# Patient Record
Sex: Male | Born: 1995 | Race: White | Hispanic: No | Marital: Single | Smoking: Never smoker
Health system: Southern US, Community
[De-identification: ages and names within clinical notes are randomized; demographics above are authoritative.]

## PROBLEM LIST (undated history)

## (undated) DIAGNOSIS — M659 Synovitis and tenosynovitis, unspecified: Secondary | ICD-10-CM

## (undated) DIAGNOSIS — M25372 Other instability, left ankle: Secondary | ICD-10-CM

## (undated) HISTORY — PX: WISDOM TOOTH EXTRACTION: SHX21

## (undated) HISTORY — PX: COLONOSCOPY: SHX174

---
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
## 2013-03-16 IMAGING — CR DG FOOT COMPLETE 3+V*R*
3 series · 3 of 3 positions shown · non-contrast
Comparison: None.

CLINICAL DATA: Right foot injury with swelling.

RIGHT FOOT COMPLETE - 3+ VIEW

[x foot ap right]
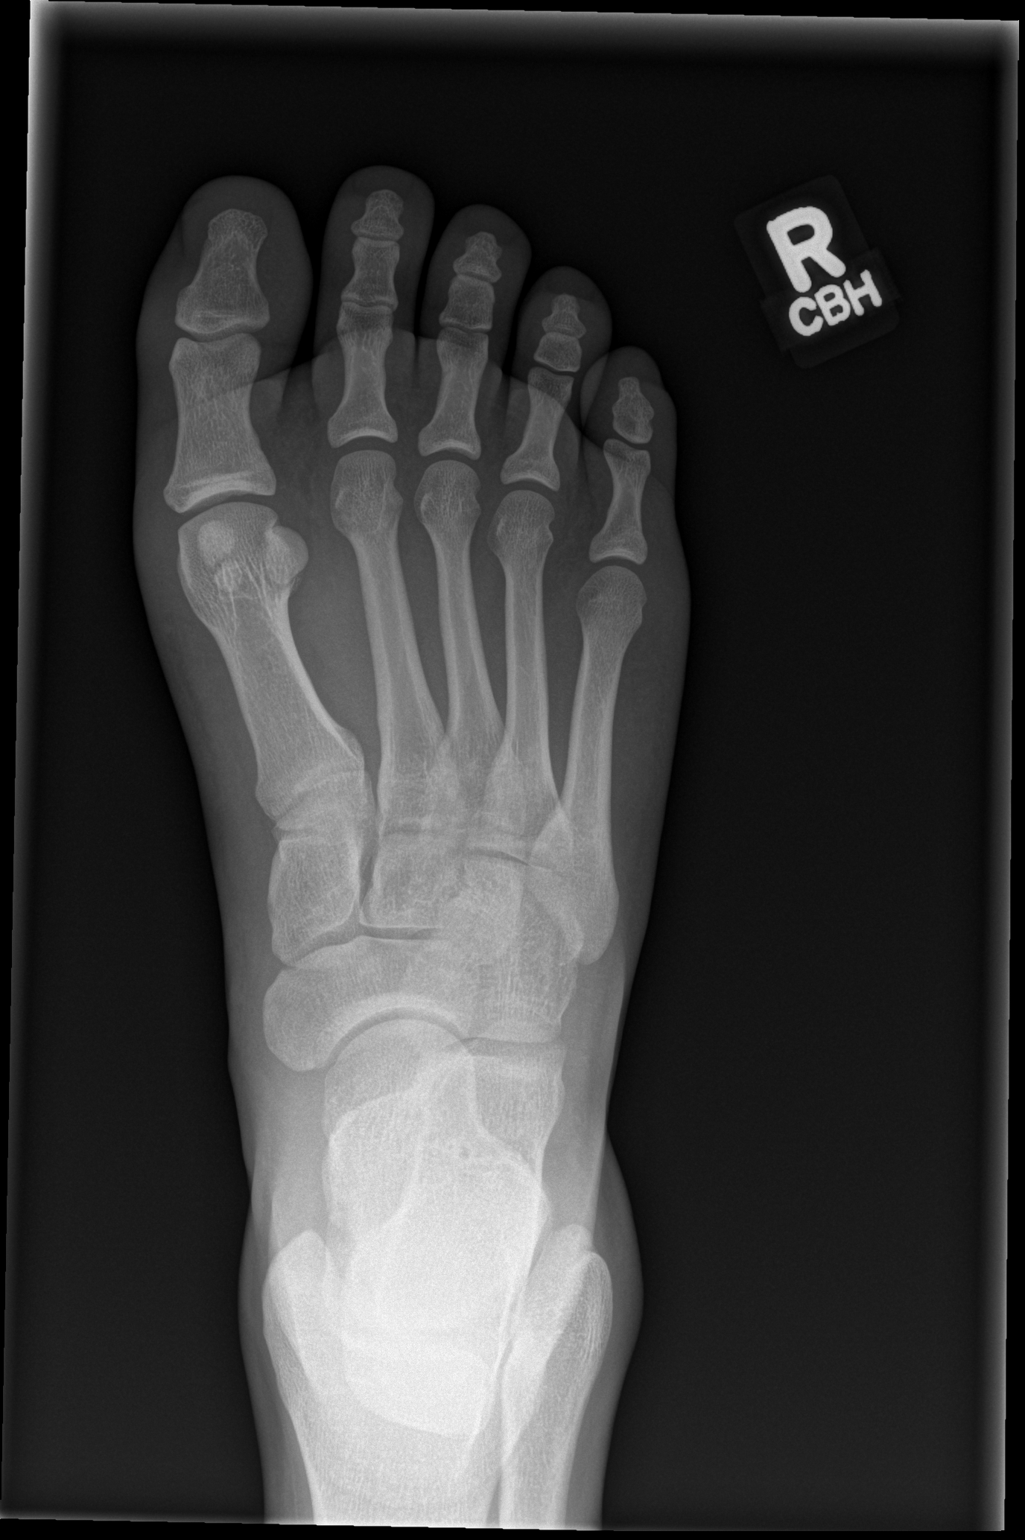

[x foot obl right]
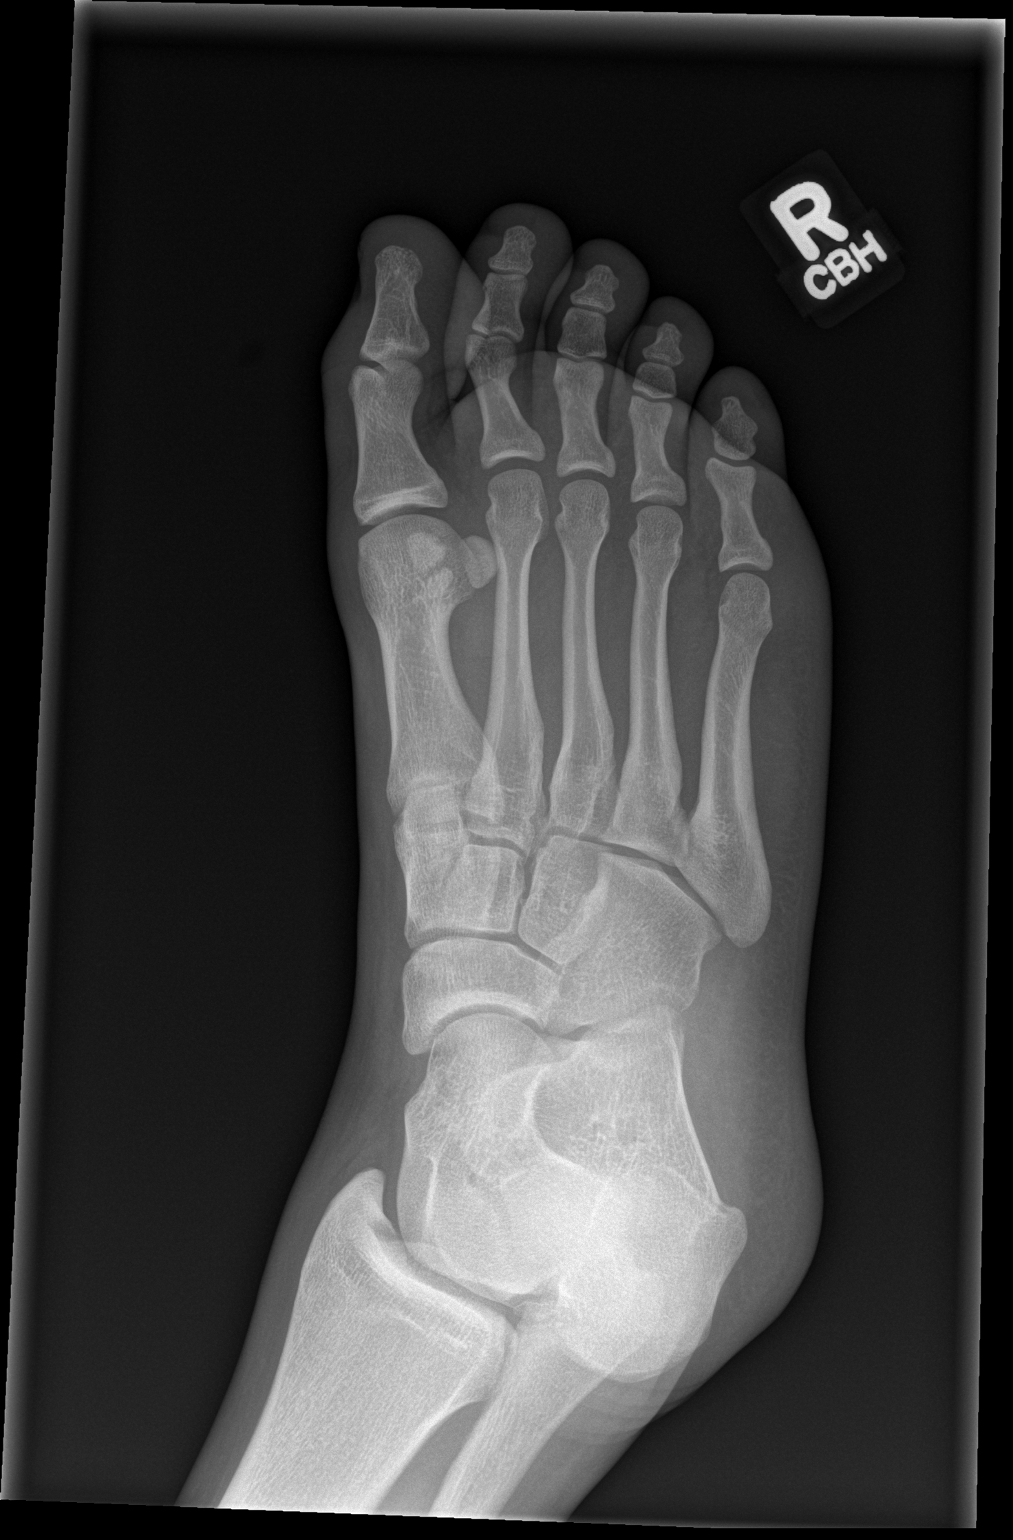

[x foot lat right]
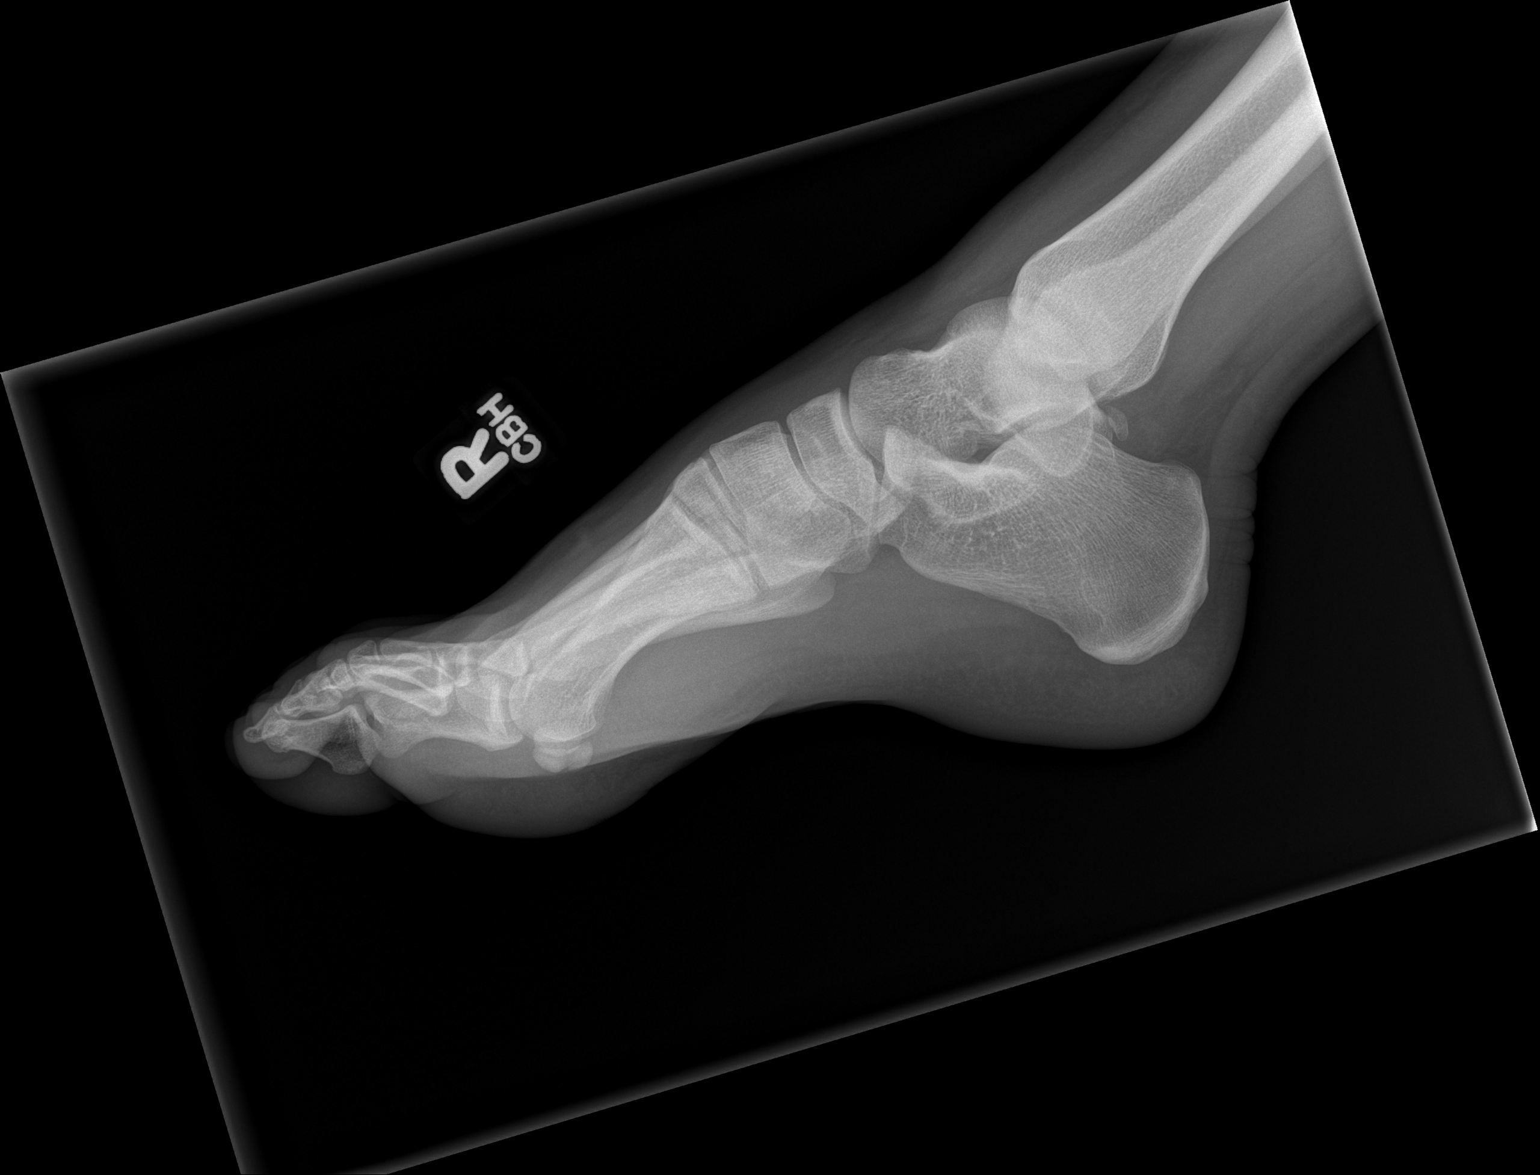

[3 of 3 positions shown; findings below may reference images not displayed]

FINDINGS: The mineralization and alignment are normal.  There is no
evidence of acute fracture or dislocation.  The tibial sesamoid of
the first metatarsal is bipartite.  No focal soft tissue swelling
is evident.
IMPRESSION: No acute osseous findings.

## 2014-11-27 DIAGNOSIS — M25372 Other instability, left ankle: Secondary | ICD-10-CM

## 2014-11-27 DIAGNOSIS — M65972 Unspecified synovitis and tenosynovitis, left ankle and foot: Secondary | ICD-10-CM

## 2014-11-27 DIAGNOSIS — M659 Synovitis and tenosynovitis, unspecified: Secondary | ICD-10-CM

## 2014-11-27 HISTORY — DX: Other instability, left ankle: M25.372

## 2014-11-27 HISTORY — DX: Synovitis and tenosynovitis, unspecified: M65.9

## 2014-11-27 HISTORY — DX: Unspecified synovitis and tenosynovitis, left ankle and foot: M65.972

## 2014-11-28 ENCOUNTER — Other Ambulatory Visit: Payer: Self-pay | Admitting: Orthopedic Surgery

## 2014-12-03 ENCOUNTER — Encounter (HOSPITAL_BASED_OUTPATIENT_CLINIC_OR_DEPARTMENT_OTHER): Payer: Self-pay | Admitting: *Deleted

## 2014-12-06 ENCOUNTER — Ambulatory Visit (HOSPITAL_BASED_OUTPATIENT_CLINIC_OR_DEPARTMENT_OTHER): Payer: 59 | Admitting: Certified Registered"

## 2014-12-06 ENCOUNTER — Encounter (HOSPITAL_BASED_OUTPATIENT_CLINIC_OR_DEPARTMENT_OTHER)
Admission: RE | Disposition: A | Discharge status: Home / Self Care | Payer: Self-pay | Source: Ambulatory Visit | Attending: Orthopedic Surgery

## 2014-12-06 ENCOUNTER — Ambulatory Visit (HOSPITAL_BASED_OUTPATIENT_CLINIC_OR_DEPARTMENT_OTHER)
Admission: RE | Admit: 2014-12-06 | Discharge: 2014-12-06 | Disposition: A | Discharge status: Home / Self Care | Payer: 59 | Source: Ambulatory Visit | Attending: Orthopedic Surgery | Admitting: Orthopedic Surgery

## 2014-12-06 ENCOUNTER — Encounter (HOSPITAL_BASED_OUTPATIENT_CLINIC_OR_DEPARTMENT_OTHER): Payer: Self-pay

## 2014-12-06 DIAGNOSIS — M65872 Other synovitis and tenosynovitis, left ankle and foot: Secondary | ICD-10-CM | POA: Diagnosis not present

## 2014-12-06 DIAGNOSIS — M25372 Other instability, left ankle: Secondary | ICD-10-CM | POA: Insufficient documentation

## 2014-12-06 DIAGNOSIS — Z91030 Bee allergy status: Secondary | ICD-10-CM | POA: Insufficient documentation

## 2014-12-06 DIAGNOSIS — E739 Lactose intolerance, unspecified: Secondary | ICD-10-CM | POA: Insufficient documentation

## 2014-12-06 HISTORY — DX: Other instability, left ankle: M25.372

## 2014-12-06 HISTORY — PX: ANKLE ARTHROSCOPY WITH RECONSTRUCTION: SHX5583

## 2014-12-06 HISTORY — DX: Synovitis and tenosynovitis, unspecified: M65.9

## 2014-12-06 LAB — POCT HEMOGLOBIN-HEMACUE: HEMOGLOBIN: 15.2 g/dL (ref 13.0–17.0)

## 2014-12-06 SURGERY — ARTHROSCOPY, ANKLE, WITH RECONSTRUCTION
Anesthesia: General | Site: Ankle | Laterality: Left

## 2014-12-06 MED ORDER — SODIUM CHLORIDE 0.9 % IR SOLN
Status: DC | PRN
  Administered 2014-12-06: 1

## 2014-12-06 MED ORDER — OXYCODONE HCL 5 MG PO TABS
ORAL_TABLET | ORAL | Status: AC
  Filled 2014-12-06: qty 1

## 2014-12-06 MED ORDER — BUPIVACAINE-EPINEPHRINE 0.5% -1:200000 IJ SOLN
INTRAMUSCULAR | Status: DC | PRN
  Administered 2014-12-06: 28 mL

## 2014-12-06 MED ORDER — DEXAMETHASONE SODIUM PHOSPHATE 10 MG/ML IJ SOLN: INTRAMUSCULAR | Status: DC | PRN

## 2014-12-06 MED ORDER — MIDAZOLAM HCL 2 MG/2ML IJ SOLN
INTRAMUSCULAR | Status: AC
  Filled 2014-12-06: qty 4

## 2014-12-06 MED ORDER — CEFAZOLIN SODIUM-DEXTROSE 2-3 GM-% IV SOLR
INTRAVENOUS | Status: AC
  Filled 2014-12-06: qty 50

## 2014-12-06 MED ORDER — MEPERIDINE HCL 25 MG/ML IJ SOLN: 6.2500 mg | INTRAMUSCULAR | Status: DC | PRN

## 2014-12-06 MED ORDER — DEXAMETHASONE SODIUM PHOSPHATE 10 MG/ML IJ SOLN
INTRAMUSCULAR | Status: DC | PRN
  Administered 2014-12-06: 10 mg via INTRAVENOUS

## 2014-12-06 MED ORDER — OXYCODONE HCL 5 MG PO TABS
5.0000 mg | ORAL_TABLET | Freq: Once | ORAL | Status: AC
  Administered 2014-12-06: 5 mg via ORAL

## 2014-12-06 MED ORDER — SODIUM CHLORIDE 0.9 % IV SOLN: INTRAVENOUS | Status: DC

## 2014-12-06 MED ORDER — ONDANSETRON HCL 4 MG/2ML IJ SOLN
INTRAMUSCULAR | Status: AC
  Filled 2014-12-06: qty 2

## 2014-12-06 MED ORDER — HYDROMORPHONE HCL 1 MG/ML IJ SOLN
INTRAMUSCULAR | Status: AC
  Filled 2014-12-06: qty 1

## 2014-12-06 MED ORDER — ONDANSETRON HCL 4 MG/2ML IJ SOLN
4.0000 mg | Freq: Once | INTRAMUSCULAR | Status: DC | PRN
Start: 2014-12-06 — End: 2014-12-06

## 2014-12-06 MED ORDER — OXYCODONE HCL 5 MG PO TABS: 5.0000 mg | ORAL_TABLET | ORAL | Status: AC | PRN

## 2014-12-06 MED ORDER — SCOPOLAMINE 1 MG/3DAYS TD PT72: 1.0000 | MEDICATED_PATCH | Freq: Once | TRANSDERMAL | Status: DC | PRN

## 2014-12-06 MED ORDER — HYDROMORPHONE HCL 1 MG/ML IJ SOLN
0.2500 mg | INTRAMUSCULAR | Status: DC | PRN
  Administered 2014-12-06 (×4): 0.5 mg via INTRAVENOUS

## 2014-12-06 MED ORDER — DEXAMETHASONE SODIUM PHOSPHATE 10 MG/ML IJ SOLN
INTRAMUSCULAR | Status: AC
  Filled 2014-12-06: qty 1

## 2014-12-06 MED ORDER — LACTATED RINGERS IV SOLN
INTRAVENOUS | Status: DC
  Administered 2014-12-06 (×2): via INTRAVENOUS

## 2014-12-06 MED ORDER — BUPIVACAINE-EPINEPHRINE (PF) 0.5% -1:200000 IJ SOLN
INTRAMUSCULAR | Status: AC
  Filled 2014-12-06: qty 30

## 2014-12-06 MED ORDER — LIDOCAINE HCL (CARDIAC) 20 MG/ML IV SOLN
INTRAVENOUS | Status: DC | PRN
  Administered 2014-12-06: 100 mg via INTRAVENOUS

## 2014-12-06 MED ORDER — PROPOFOL 10 MG/ML IV BOLUS
INTRAVENOUS | Status: DC | PRN
  Administered 2014-12-06: 50 mg via INTRAVENOUS
  Administered 2014-12-06: 150 mg via INTRAVENOUS

## 2014-12-06 MED ORDER — FENTANYL CITRATE (PF) 100 MCG/2ML IJ SOLN
INTRAMUSCULAR | Status: AC
  Filled 2014-12-06: qty 4

## 2014-12-06 MED ORDER — MIDAZOLAM HCL 2 MG/2ML IJ SOLN
1.0000 mg | INTRAMUSCULAR | Status: DC | PRN
  Administered 2014-12-06: 2 mg via INTRAVENOUS

## 2014-12-06 MED ORDER — CHLORHEXIDINE GLUCONATE 4 % EX LIQD: 60.0000 mL | Freq: Once | CUTANEOUS | Status: DC

## 2014-12-06 MED ORDER — NAPROXEN SODIUM 220 MG PO TABS
440.0000 mg | ORAL_TABLET | Freq: Two times a day (BID) | ORAL | Status: AC
Start: 2014-12-06 — End: 2014-12-11

## 2014-12-06 MED ORDER — KETOROLAC TROMETHAMINE 30 MG/ML IJ SOLN
INTRAMUSCULAR | Status: AC
  Filled 2014-12-06: qty 1

## 2014-12-06 MED ORDER — KETOROLAC TROMETHAMINE 30 MG/ML IJ SOLN
INTRAMUSCULAR | Status: DC | PRN
  Administered 2014-12-06: 30 mg via INTRAVENOUS

## 2014-12-06 MED ORDER — PROPOFOL 10 MG/ML IV BOLUS
INTRAVENOUS | Status: AC
  Filled 2014-12-06: qty 20

## 2014-12-06 MED ORDER — FENTANYL CITRATE (PF) 100 MCG/2ML IJ SOLN
50.0000 ug | INTRAMUSCULAR | Status: DC | PRN
  Administered 2014-12-06: 100 ug via INTRAVENOUS
  Administered 2014-12-06: 25 ug via INTRAVENOUS

## 2014-12-06 MED ORDER — CEFAZOLIN SODIUM-DEXTROSE 2-3 GM-% IV SOLR
2.0000 g | INTRAVENOUS | Status: AC
  Administered 2014-12-06: 2 g via INTRAVENOUS

## 2014-12-06 MED ORDER — GLYCOPYRROLATE 0.2 MG/ML IJ SOLN: 0.2000 mg | Freq: Once | INTRAMUSCULAR | Status: DC | PRN

## 2014-12-06 MED ORDER — LIDOCAINE HCL (CARDIAC) 20 MG/ML IV SOLN
INTRAVENOUS | Status: AC
  Filled 2014-12-06: qty 5

## 2014-12-06 SURGICAL SUPPLY — 76 items
ANCHOR JUGGERKNOT W/DRL 2/1.45 (Orthopedic Implant) ×2 IMPLANT
ANCHOR SUT 1.45 SZ 1 SHORT (Anchor) ×2 IMPLANT
BANDAGE ESMARK 6X9 LF (GAUZE/BANDAGES/DRESSINGS) ×1 IMPLANT
BLADE CUDA 2.0 (BLADE) IMPLANT
BLADE CUDA GRT WHITE 3.5 (BLADE) IMPLANT
BLADE CUDA SHAVER 3.5 (BLADE) IMPLANT
BLADE CUTTER GATOR 3.5 (BLADE) ×2 IMPLANT
BLADE SURG 15 STRL LF DISP TIS (BLADE) ×2 IMPLANT
BLADE SURG 15 STRL SS (BLADE) ×2
BNDG COHESIVE 4X5 TAN STRL (GAUZE/BANDAGES/DRESSINGS) ×2 IMPLANT
BNDG COHESIVE 6X5 TAN STRL LF (GAUZE/BANDAGES/DRESSINGS) ×2 IMPLANT
BNDG ESMARK 6X9 LF (GAUZE/BANDAGES/DRESSINGS) ×2
BOOT STEPPER DURA LG (SOFTGOODS) IMPLANT
BOOT STEPPER DURA MED (SOFTGOODS) IMPLANT
BOOT STEPPER DURA SM (SOFTGOODS) IMPLANT
BUR 3.5 LG SPHERICAL (BURR) IMPLANT
BUR CUDA 2.9 (BURR) ×2 IMPLANT
BUR FULL RADIUS 2.9 (BURR) IMPLANT
BUR GATOR 2.9 (BURR) IMPLANT
BUR OVAL 4.0 (BURR) IMPLANT
BUR SPHERICAL 2.9 (BURR) IMPLANT
BUR VERTEX HOODED 4.5 (BURR) IMPLANT
BURR 3.5 LG SPHERICAL (BURR)
CHLORAPREP W/TINT 26ML (MISCELLANEOUS) ×2 IMPLANT
CUFF TOURNIQUET SINGLE 34IN LL (TOURNIQUET CUFF) ×2 IMPLANT
DRAPE EXTREMITY T 121X128X90 (DRAPE) ×2 IMPLANT
DRAPE OEC MINIVIEW 54X84 (DRAPES) IMPLANT
DRAPE U-SHAPE 47X51 STRL (DRAPES) ×2 IMPLANT
DRSG MEPITEL 4X7.2 (GAUZE/BANDAGES/DRESSINGS) ×2 IMPLANT
DRSG PAD ABDOMINAL 8X10 ST (GAUZE/BANDAGES/DRESSINGS) ×2 IMPLANT
ELECT REM PT RETURN 9FT ADLT (ELECTROSURGICAL) ×2
ELECTRODE REM PT RTRN 9FT ADLT (ELECTROSURGICAL) ×1 IMPLANT
GAUZE SPONGE 4X4 12PLY STRL (GAUZE/BANDAGES/DRESSINGS) ×2 IMPLANT
GLOVE BIO SURGEON STRL SZ8 (GLOVE) ×2 IMPLANT
GLOVE BIOGEL PI IND STRL 7.0 (GLOVE) ×1 IMPLANT
GLOVE BIOGEL PI IND STRL 8 (GLOVE) ×1 IMPLANT
GLOVE BIOGEL PI INDICATOR 7.0 (GLOVE) ×1
GLOVE BIOGEL PI INDICATOR 8 (GLOVE) ×1
GLOVE ECLIPSE 6.5 STRL STRAW (GLOVE) ×2 IMPLANT
GLOVE ECLIPSE 7.5 STRL STRAW (GLOVE) ×2 IMPLANT
GLOVE EXAM NITRILE MD LF STRL (GLOVE) ×2 IMPLANT
GOWN STRL REUS W/ TWL LRG LVL3 (GOWN DISPOSABLE) ×1 IMPLANT
GOWN STRL REUS W/ TWL XL LVL3 (GOWN DISPOSABLE) ×1 IMPLANT
GOWN STRL REUS W/TWL LRG LVL3 (GOWN DISPOSABLE) ×1
GOWN STRL REUS W/TWL XL LVL3 (GOWN DISPOSABLE) ×1
IV NS IRRIG 3000ML ARTHROMATIC (IV SOLUTION) ×2 IMPLANT
MANIFOLD NEPTUNE II (INSTRUMENTS) ×2 IMPLANT
NS IRRIG 1000ML POUR BTL (IV SOLUTION) IMPLANT
PACK ARTHROSCOPY DSU (CUSTOM PROCEDURE TRAY) ×2 IMPLANT
PACK BASIN DAY SURGERY FS (CUSTOM PROCEDURE TRAY) ×2 IMPLANT
PAD CAST 4YDX4 CTTN HI CHSV (CAST SUPPLIES) ×1 IMPLANT
PADDING CAST ABS 4INX4YD NS (CAST SUPPLIES) ×1
PADDING CAST ABS COTTON 4X4 ST (CAST SUPPLIES) ×1 IMPLANT
PADDING CAST COTTON 4X4 STRL (CAST SUPPLIES) ×1
PADDING CAST COTTON 6X4 STRL (CAST SUPPLIES) ×2 IMPLANT
PENCIL BUTTON HOLSTER BLD 10FT (ELECTRODE) ×2 IMPLANT
SANITIZER HAND PURELL 535ML FO (MISCELLANEOUS) ×2 IMPLANT
SET IRRIG Y TYPE TUR BLADDER L (SET/KITS/TRAYS/PACK) ×2 IMPLANT
SLEEVE SCD COMPRESS KNEE MED (MISCELLANEOUS) ×2 IMPLANT
SPLINT FAST PLASTER 5X30 (CAST SUPPLIES) ×20
SPLINT PLASTER CAST FAST 5X30 (CAST SUPPLIES) ×20 IMPLANT
SPONGE LAP 18X18 X RAY DECT (DISPOSABLE) ×2 IMPLANT
STOCKINETTE 6  STRL (DRAPES) ×1
STOCKINETTE 6 STRL (DRAPES) ×1 IMPLANT
STRAP ANKLE FOOT DISTRACTOR (ORTHOPEDIC SUPPLIES) ×2 IMPLANT
SUCTION FRAZIER TIP 10 FR DISP (SUCTIONS) ×2 IMPLANT
SUT ETHILON 3 0 PS 1 (SUTURE) ×2 IMPLANT
SUT MNCRL AB 3-0 PS2 18 (SUTURE) ×2 IMPLANT
SUT VIC AB 0 SH 27 (SUTURE) IMPLANT
SUT VIC AB 2-0 SH 27 (SUTURE)
SUT VIC AB 2-0 SH 27XBRD (SUTURE) IMPLANT
SYR BULB 3OZ (MISCELLANEOUS) IMPLANT
TOWEL OR 17X24 6PK STRL BLUE (TOWEL DISPOSABLE) ×2 IMPLANT
TUBE CONNECTING 20X1/4 (TUBING) IMPLANT
WAND STAR VAC 90 (SURGICAL WAND) IMPLANT
WATER STERILE IRR 1000ML POUR (IV SOLUTION) ×2 IMPLANT

## 2014-12-06 NOTE — Brief Op Note (Signed)
12/06/2014  4:37 PM  PATIENT:  Lynita LombardKeith Suski  19 y.o. male  PRE-OPERATIVE DIAGNOSIS:  LEFT ANKLE CHRONIC INSTABILIITY; SYNOVITIS   POST-OPERATIVE DIAGNOSIS:  LEFT ANKLE CHRONIC INSTABILIITY; SYNOVITIS   Procedure(s): 1.  LEFT ANKLE ARTHROSCOPY WITH limited DEBRIDEMENT 2.  Left ankle LATERAL LIGAMENT RECONSTRUCTION through a separate incision  SURGEON:  Toni ArthursJohn Kass Herberger, MD  ASSISTANT: n/a  ANESTHESIA:   General  EBL:  minimal   TOURNIQUET:   Total Tourniquet Time Documented: Thigh (Left) - 44 minutes Total: Thigh (Left) - 44 minutes  COMPLICATIONS:  None apparent  DISPOSITION:  Extubated, awake and stable to recovery.  DICTATION ID:  [id number]

## 2014-12-06 NOTE — Discharge Instructions (Addendum)
Toni ArthursJohn Hewitt, MD [facility]  Please read the following information regarding your care after surgery.  Medications  You only need a prescription for the narcotic pain medicine (ex. oxycodone, Percocet, Norco).  All of the other medicines listed below are available over the counter. X acetominophen (Tylenol) 650 mg every 4-6 hours as you need for minor pain X oxycodone as prescribed for moderate to severe pain X Aleve 2 pills twice a day for 5 days   X To help prevent blood clots, take an aspirin (325 mg) once a day for two weeks after surgery.  You should also get up every hour while you are awake to move around.    Weight Bearing ? Bear weight when you are able on your operated leg or foot. ? Bear weight only on the heel of your operated foot in the post-op shoe. X Do not bear any weight on the operated leg or foot.  Cast / Splint / Dressing X Keep your splint or cast clean and dry.  Dont put anything (coat hanger, pencil, etc) down inside of it.  If it gets damp, use a hair dryer on the cool setting to dry it.  If it gets soaked, call the office to schedule an appointment for a cast change. ? Remove your dressing 3 days after surgery and cover the incisions with dry dressings.    After your dressing, cast or splint is removed; you may shower, but do not soak or scrub the wound.  Allow the water to run over it, and then gently pat it dry.  Swelling It is normal for you to have swelling where you had surgery.  To reduce swelling and pain, keep your toes above your nose for at least 3 days after surgery.  It may be necessary to keep your foot or leg elevated for several weeks.  If it hurts, it should be elevated.  Follow Up Call my office at [phone] when you are discharged from the hospital or surgery center to schedule an appointment to be seen two weeks after surgery.  Call my office at [phone] if you develop a fever >101.5 F, nausea, vomiting, bleeding from the  surgical site or severe pain.     Post Anesthesia Home Care Instructions  Activity: Get plenty of rest for the remainder of the day. A responsible adult should stay with you for 24 hours following the procedure.  For the next 24 hours, DO NOT: -Drive a car -Advertising copywriterperate machinery -Drink alcoholic beverages -Take any medication unless instructed by your physician -Make any legal decisions or sign important papers.  Meals: Start with liquid foods such as gelatin or soup. Progress to regular foods as tolerated. Avoid greasy, spicy, heavy foods. If nausea and/or vomiting occur, drink only clear liquids until the nausea and/or vomiting subsides. Call your physician if vomiting continues.  Special Instructions/Symptoms: Your throat may feel dry or sore from the anesthesia or the breathing tube placed in your throat during surgery. If this causes discomfort, gargle with warm salt water. The discomfort should disappear within 24 hours.  If you had a scopolamine patch placed behind your ear for the management of post- operative nausea and/or vomiting:  1. The medication in the patch is effective for 72 hours, after which it should be removed.  Wrap patch in a tissue and discard in the trash. Wash hands thoroughly with soap and water. 2. You may remove the patch earlier than 72 hours if you experience unpleasant side effects which  may include dry mouth, dizziness or visual disturbances. 3. Avoid touching the patch. Wash your hands with soap and water after contact with the patch.

## 2014-12-06 NOTE — Anesthesia Procedure Notes (Signed)
Procedure Name: LMA Insertion Date/Time: 12/06/2014 3:23 PM Performed by: Gar GibbonKEETON, Havannah Streat S Pre-anesthesia Checklist: Patient identified, Emergency Drugs available, Suction available and Patient being monitored Patient Re-evaluated:Patient Re-evaluated prior to inductionOxygen Delivery Method: Circle System Utilized Preoxygenation: Pre-oxygenation with 100% oxygen Intubation Type: IV induction Ventilation: Mask ventilation without difficulty LMA: LMA inserted LMA Size: 4.0 Number of attempts: 1 Airway Equipment and Method: Bite block Placement Confirmation: positive ETCO2 Tube secured with: Tape Dental Injury: Teeth and Oropharynx as per pre-operative assessment

## 2014-12-06 NOTE — Transfer of Care (Signed)
Immediate Anesthesia Transfer of Care Note  Patient: Tom Mendoza  Procedure(s) Performed: Procedure(s): LEFT ANKLE ARTHROSCOPY WITH EXTENSIVE DEBRIDEMENT AND LATERAL LIGAMENT RECONSTRUCTION  (Left)  Patient Location: PACU  Anesthesia Type:General  Level of Consciousness: awake and sedated  Airway & Oxygen Therapy: Patient Spontanous Breathing and Patient connected to face mask oxygen  Post-op Assessment: Report given to RN and Post -op Vital signs reviewed and stable  Post vital signs: Reviewed and stable  Last Vitals:  Filed Vitals:   12/06/14 1347  BP: 135/67  Pulse: 60  Temp: 36.7 C  Resp: 20    Complications: No apparent anesthesia complications

## 2014-12-06 NOTE — Anesthesia Postprocedure Evaluation (Signed)
Anesthesia Post Note  Patient: Tom LombardKeith Arreguin  Procedure(s) Performed: Procedure(s) (LRB): LEFT ANKLE ARTHROSCOPY WITH EXTENSIVE DEBRIDEMENT AND LATERAL LIGAMENT RECONSTRUCTION  (Left)  Anesthesia type: general  Patient location: PACU  Post pain: Pain level controlled  Post assessment: Patient's Cardiovascular Status Stable  Last Vitals:  Filed Vitals:   12/06/14 1715  BP:   Pulse: 108  Temp:   Resp: 19    Post vital signs: Reviewed and stable  Level of consciousness: sedated  Complications: No apparent anesthesia complications

## 2014-12-06 NOTE — Anesthesia Preprocedure Evaluation (Addendum)

## 2014-12-06 NOTE — H&P (Signed)
Tom LombardKeith Mendoza is an 19 y.o. male.   Chief Complaint:  Left ankle pain and instability HPI:  19 y/o male with left ankle instability after a remote injury.  He has failed non op treatment and presents now for operative stabilization.  Past Medical History  Diagnosis Date  . 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 Wisdom tooth extraction    . Colonoscopy      History reviewed. No pertinent family history. Social History:  reports that he has never smoked. He has never used smokeless tobacco. He reports that he does not drink alcohol or use illicit drugs.  Allergies:  Allergies  Allergen Reactions  . Bee Venom Swelling  . Lactose Intolerance (Gi) Other (See Comments)    GI UPSET    Medications Prior to Admission  Medication Sig Dispense Refill  . Multiple Vitamin (MULTIVITAMIN) tablet Take 1 tablet by mouth daily.      Results for orders placed or performed during the hospital encounter of 12/06/14 (from the past 48 hour(s))  Hemoglobin-hemacue, POC     Status: None   Collection Time: 12/06/14  1:57 PM  Result Value Ref Range   Hemoglobin 15.2 13.0 - 17.0 g/dL   No results found.  ROS  No recent f/c/n/v/wt loss  Blood pressure 135/67, pulse 60, temperature 98.1 F (36.7 C), temperature source Oral, resp. rate 20, height 5\' 10"  (1.778 m), weight 95.981 kg (211 lb 9.6 oz), SpO2 98 %. Physical Exam  wn wd male in nad  A and O x 4.  Mood and affect normal.  EOMI.  resp unlabored.  L ankle with  Grade 1 anterior drawer in plantar flexion and grade 1 in neutral.  Skin healthy and intact.  Sens to LT intact about the foot and ankle.  5/5 strength in PF, DF, inversion and eversion.  Assessment/Plan R ankle instability and synovitis - to OR for right ankle arthroscopy and debridement and lateral ligament reconstruction.  The risks and benefits of the alternative treatment options have been  discussed in detail.  The patient wishes to proceed with surgery and specifically understands risks of bleeding, infection, nerve damage, blood clots, need for additional surgery, amputation and death.   Toni ArthursHEWITT, Jacquelynne Guedes 12/06/2014, 2:57 PM

## 2014-12-07 NOTE — Op Note (Signed)
NAME:  JEREMIE, GIANGRANDE NO.:  [id number]  MEDICAL RECORD NO.:  [id number]  LOCATION:                                 FACILITY:  PHYSICIAN:  Toni Arthurs, MD             DATE OF BIRTH:  DATE OF PROCEDURE:  12/06/2014 DATE OF DISCHARGE:                              OPERATIVE REPORT   PREOPERATIVE DIAGNOSES: 1. Left ankle chronic instability. 2. Left ankle synovitis.  POSTOPERATIVE DIAGNOSES: 1. Left ankle chronic instability. 2. Left ankle synovitis.  PROCEDURE: 1. Left ankle arthroscopy with limited debridement. 2. Left ankle lateral ligament reconstruction through a separate     incision.  SURGEON:  Toni Arthurs, MD  ANESTHESIA:  General.  ESTIMATED BLOOD LOSS:  Minimal.  TOURNIQUET TIME:  44 minutes at 250 mmHg.  COMPLICATIONS:  None apparent.  DISPOSITION:  Extubated, awake, and stable to recovery.  INDICATIONS FOR PROCEDURE:  The patient is a 19 year old male without significant past medical history.  He has a history of chronic left ankle instability and synovitis.  This is due to an ankle injury in the remote past.  He has failed nonoperative treatment to date and presents today for surgical treatment.  He understands the risks, benefits, the alternative treatment options, and elects surgical treatment.  He specifically understands risks of bleeding, infection, nerve damage, blood clots, need for additional surgery, continued pain, recurrence of his instability, amputation, and death.  PROCEDURE IN DETAIL:  After preoperative consent was obtained and the correct operative site was identified, the patient was brought to the operating room and placed supine on the operating table.  General anesthesia was induced.  Preoperative antibiotics were administered. Surgical time-out was taken.  Left lower extremity was prepped and draped in standard sterile fashion with tourniquet around the thigh. The extremity was exsanguinated and the tourniquet was  inflated to 250 mmHg.  An anteromedial arthroscopy portal was established using the nick and spread technique.  The arthroscope was inserted into the ankle joint.  Immediately evident was synovitis in the anterior gutter.  An anterolateral arthroscopy portal was then established under direct vision again using the nick and spread technique.  The arthroscopic shaver was inserted into the joint and anterior synovitis was resected. This allowed appropriate visualization of the remainder of the joint. The lateral gutter was examined, there was no evidence of loose body. The syndesmosis appeared healthy and stable as well.  The posteromedial and posteromedial gutters appeared healthy with no significant synovitis or evidence of loose body.  The articular cartilage of the tibial plafond and the talar dome appeared healthy and intact.  The medial gutter also appeared healthy with no evidence of loose body. Arthroscopic instruments were then removed in their entirety.  The wounds were closed with horizontal mattress sutures of 3-0 nylon.  Attention was then turned to the lateral aspect of the ankle where an oblique incision was made over the lateral malleolus.  Sharp dissection was carried down through skin and subcutaneous tissue.  Anterior talofibular and calcaneofibular ligaments were then released from their insertion on the anterolateral fibula.  They were mobilized appropriately.  The retinaculum was dissected and  mobilized as well.  A rongeur was used to remove the cortical bone from the fibula at the origin of the 2 ligaments.  A Biomet JuggerKnot anchors were inserted at the origin of the ATFL and CFL.  Sutures were then passed into the stumps of the ATFL and CFL ligaments.  The ankle was maximally everted and the sutures were tied advancing the ligaments up on to the fibula appropriately.  The sutures were then passed beneath the periosteum of the distal fibula pulling it down over the  ATFL and CFL.  Sutures were then passed through the retinaculum advancing it up on to the fibula. Sutures were then tied.  The anterior drawer was noted to be grade 0, neutral, and in plantar flexion at that time.  Wound was irrigated copiously and closed with Monocryl and nylon.  Sterile dressings were applied followed by a well-padded short-leg splint.  The patient's tourniquet was released at 44 minutes after application of the dressings.  The patient was awakened from anesthesia and transported to the recovery room in stable condition.  FOLLOWUP PLAN:  The patient will be nonweightbearing on the left lower extremity for the next 2 weeks.  He will follow up with me in the office and we will remove the sutures and get him in a CAM walker boot to allow him to begin bearing weight as tolerated.  He will begin to work on active range of motion at that time as well.     Toni ArthursJohn Jenevie Casstevens, MD     JH/MEDQ  D:  12/06/2014  T:  12/07/2014  Job:  [id number]

## 2014-12-10 ENCOUNTER — Encounter (HOSPITAL_BASED_OUTPATIENT_CLINIC_OR_DEPARTMENT_OTHER): Payer: Self-pay | Admitting: Orthopedic Surgery

## 2016-07-21 NOTE — Progress Notes (Signed)
[facility] [address] [place], [place] [id number]    Acknowledgment of Informed Consent for Surgical or Medical Procedure and Sedation  I agree to allow doctor(s) D[facility] and his/her associates or assistants, including residents and/or other qualified medical practitioner to perform the following medical treatment or procedure and to administer or direct the administration of sedation as necessary:  Procedure(s): LEFT ANKLE ARTHROSCOPY WITH SYNOVECTOMY; LATERAL ANKLE LIGAMENT RECONSTRUCTION MODIFIED CHRISMAN-SNOOK TYPE PROCEDURE  My doctor has explained the following regarding the proposed procedure:  . the explanation of the procedure  . the benefits of the procedure  . the potential problems that might occur during recuperation  . the risks and side effects of the procedure which could include but are not limited to severe blood loss, infection, stroke or death  . the benefits, risks and side effect of alternative procedures including the consequences of declining this procedure or any alternative procedures  . the likelihood of achieving satisfactory results.  I acknowledge no guarantee or assurance has been made to me regarding the results.    I understand that during the course of this treatment/procedure, unforeseen conditions can occur which require an additional or different procedure.  I agree to allow my physician or assistants to perform such extension of the original procedure as they may find necessary.    I understand that sedation will often result in temporary impairment of memory and fine motor skills and that sedation can occasionally progress to a state of deep sedation or general anesthesia.    I understand the risks of anesthesia for surgery include, but are not limited to, sore throat, hoarseness, injury to face, mouth, or teeth; nausea; headache; injury to blood vessels or nerves; death, brain damage, or paralysis.    I understand that if I have a  Limitation of Treatment order in effect during my hospitalization, the order may or may not be in effect during this procedure.     I give my doctor permission to give me blood or blood products.  I understand that there are risks with receiving blood such as hepatitis, AIDS, fever, or allergic reaction.  I acknowledge that the risks, benefits, and alternatives of this treatment have been explained to me and that no express or implied warranty has been given by the hospital, any blood bank, or any person or entity as to the blood or blood components transfused.    At the discretion of my doctor, I agree to allow observers, equipment/product representatives and allow photographing, and/or televising of the procedure, provided my name or identity is maintained confidentially.      I agree the hospital may dispose of or use for scientific or educational purposes any tissue, fluid, or body parts which may be removed.    ________________________________Date________Time______ am/pm  (Circle One)  Patient or Signature of Closest Relative or Legal Guardian    ________________________________Date________Time______am/pm      Page 1 of  1  Witness

## 2016-07-22 NOTE — Progress Notes (Addendum)
PRE-OP INSTRUCTIONS FOR THE SURGICAL PATIENT YOU ARE UNABLE TO MAKE CONTACT FOR AN INTERVIEW:      1. Follow instructions for your ARRIVAL TIME as DIRECTED BY YOUR SURGEON.   2. Enter the MAIN entrance [address] on [address] and report to the desk.   3. Bring your insurance & prescription card and photo ID with you. You may also be asked to pay a co-pay, as you may want to bring a check or credit card with you.   4. Leave all other valuables at home.               5. Arrange for someone to drive you home and be with you for the first 24 hours after discharge.  6. You must contact your surgeon for ALL medication instructions, especially if taking blood thinners, aspirin, or diabetic medication.  7. A Pre-op History and Physical for surgery MUST be completed by your Physician or an Urgent Care within 30 days of your procedure date.  Please bring a copy with you on the day of your procedure and along with any other testing performed.  8. DO NOT EAT OR DRINK ANYTHING AFTER MIDNIGHT, including gum, candy, mints or ice chips   9. Dress in loose, comfortable clothing appropriate for redressing after your procedure. Do not wear jewelry (including body piercings), make-up, fingernail polish, lotion, powders or metal hairclips. Contacts will need to be removed prior to surgery.  10. If you use a CPAP, please bring it with you on the day of your procedure.  11. Do not shave or wax for 72 hours prior to procedure near your operative site  12. FOR WOMAN OF CHILDBEARING AGE ONLY- please bring a urine sample with you on day of surgery or make sure we can collect on arrival.  Pt to bring H&P from urgent DOS per scheduling sheet  If you have further questions, you may contact us at [phone]    Left instructions on patient's voicemail.    Jamie Flores.07/22/2016 .11:33 AM

## 2016-07-23 ENCOUNTER — Inpatient Hospital Stay: Admit: 2016-07-22 | Attending: Sports Medicine | Primary: Family Medicine

## 2016-07-23 ENCOUNTER — Encounter: Admit: 2016-07-23 | Attending: Sports Medicine | Primary: Family Medicine

## 2016-07-23 DIAGNOSIS — G8918 Other acute postprocedural pain: Secondary | ICD-10-CM

## 2016-07-23 LAB — CBC
Hematocrit: 45.7 % (ref 40.5–52.5)
Hemoglobin: 15.4 g/dL (ref 13.5–17.5)
MCH: 28.4 pg (ref 26.0–34.0)
MCHC: 33.8 g/dL (ref 31.0–36.0)
MCV: 84.2 fL (ref 80.0–100.0)
MPV: 7.9 fL (ref 5.0–10.5)
Platelets: 337 10*3/uL (ref 135–450)
RBC: 5.43 M/uL (ref 4.20–5.90)
RDW: 13.2 % (ref 12.4–15.4)
WBC: 9.1 10*3/uL (ref 4.0–11.0)

## 2016-07-23 LAB — BASIC METABOLIC PANEL
Anion Gap: 13 (ref 3–16)
BUN: 18 mg/dL (ref 7–20)
CO2: 24 mmol/L (ref 21–32)
Calcium: 10 mg/dL (ref 8.3–10.6)
Chloride: 104 mmol/L (ref 99–110)
Creatinine: 0.8 mg/dL — ABNORMAL LOW (ref 0.9–1.3)
GFR African American: >60 (ref >60)
GFR Non-African American: >60 (ref >60)
Glucose: 92 mg/dL (ref 70–99)
Potassium: 4.5 mmol/L (ref 3.5–5.1)
Sodium: 141 mmol/L (ref 136–145)

## 2016-07-23 MED ORDER — FENTANYL CITRATE (PF) 100 MCG/2ML IJ SOLN
100 | INTRAMUSCULAR | Status: AC
Start: 2016-07-23 — End: 2016-07-23

## 2016-07-23 MED ORDER — DEXTROSE 5 % IV SOLN
5 % | Freq: Once | INTRAVENOUS | Status: AC
Start: 2016-07-23 — End: 2016-07-23
  Administered 2016-07-23: 15:00:00 3 g via INTRAVENOUS

## 2016-07-23 MED ORDER — NORMAL SALINE FLUSH 0.9 % IV SOLN
0.9 | Freq: Two times a day (BID) | INTRAVENOUS | Status: DC
Start: 2016-07-23 — End: 2016-07-24

## 2016-07-23 MED ORDER — LACTATED RINGERS IV SOLN
INTRAVENOUS | Status: DC
Start: 2016-07-23 — End: 2016-07-24

## 2016-07-23 MED ORDER — NORMAL SALINE FLUSH 0.9 % IV SOLN
0.9 | INTRAVENOUS | Status: DC | PRN
Start: 2016-07-23 — End: 2016-07-24

## 2016-07-23 MED ORDER — FENTANYL CITRATE (PF) 100 MCG/2ML IJ SOLN
100 | INTRAMUSCULAR | Status: DC | PRN
Start: 2016-07-23 — End: 2016-07-24
  Administered 2016-07-23: 18:00:00 50 ug via INTRAVENOUS

## 2016-07-23 MED ORDER — DEXAMETHASONE SODIUM PHOSPHATE 4 MG/ML IJ SOLN
4 | INTRAMUSCULAR | Status: AC
Start: 2016-07-23 — End: 2016-07-23

## 2016-07-23 MED ORDER — MIDAZOLAM HCL 2 MG/2ML IJ SOLN
2 | INTRAMUSCULAR | Status: AC
Start: 2016-07-23 — End: 2016-07-23

## 2016-07-23 MED ORDER — LIDOCAINE HCL (CARDIAC) 20 MG/ML IV SOLN
20 | INTRAVENOUS | Status: AC
Start: 2016-07-23 — End: 2016-07-23

## 2016-07-23 MED ORDER — MIDAZOLAM HCL 2 MG/2ML IJ SOLN
2 | Freq: Once | INTRAMUSCULAR | Status: AC | PRN
Start: 2016-07-23 — End: 2016-07-23
  Administered 2016-07-23: 14:00:00 2 mg via INTRAVENOUS

## 2016-07-23 MED ORDER — HYDROMORPHONE HCL 1 MG/ML IJ SOLN
1 | INTRAMUSCULAR | Status: DC | PRN
Start: 2016-07-23 — End: 2016-07-24

## 2016-07-23 MED ORDER — ROPIVACAINE HCL 5 MG/ML IJ SOLN
INTRAMUSCULAR | Status: AC
Start: 2016-07-23 — End: 2016-07-23

## 2016-07-23 MED ORDER — FENTANYL CITRATE (PF) 100 MCG/2ML IJ SOLN
100 | Freq: Once | INTRAMUSCULAR | Status: AC
Start: 2016-07-23 — End: 2016-07-23
  Administered 2016-07-23: 14:00:00 50 ug via INTRAVENOUS

## 2016-07-23 MED ORDER — LABETALOL HCL 5 MG/ML IV SOLN
5 | INTRAVENOUS | Status: DC | PRN
Start: 2016-07-23 — End: 2016-07-24

## 2016-07-23 MED ORDER — MEPERIDINE HCL 25 MG/ML IJ SOLN
25 | INTRAMUSCULAR | Status: DC | PRN
Start: 2016-07-23 — End: 2016-07-24
  Administered 2016-07-23: 18:00:00 12.5 mg via INTRAVENOUS

## 2016-07-23 MED ORDER — LACTATED RINGERS IV SOLN
INTRAVENOUS | Status: DC
Start: 2016-07-23 — End: 2016-07-24
  Administered 2016-07-23: 14:00:00 via INTRAVENOUS

## 2016-07-23 MED ORDER — NEOSTIGMINE METHYLSULFATE 5 MG/5ML IV SOSY
5 | INTRAVENOUS | Status: AC
Start: 2016-07-23 — End: 2016-07-23

## 2016-07-23 MED ORDER — MORPHINE SULFATE 2 MG/ML IJ SOLN
2 | INTRAMUSCULAR | Status: DC | PRN
Start: 2016-07-23 — End: 2016-07-24

## 2016-07-23 MED ORDER — GENTAMICIN SULFATE 40 MG/ML IJ SOLN
40 | INTRAMUSCULAR | Status: AC
Start: 2016-07-23 — End: 2016-07-23

## 2016-07-23 MED ORDER — DEXTROSE 5 % IV SOLN
5 % | Freq: Once | INTRAVENOUS | Status: AC
Start: 2016-07-23 — End: 2016-07-23
  Administered 2016-07-23: 14:00:00 1250 mg via INTRAVENOUS

## 2016-07-23 MED ORDER — ONDANSETRON HCL 4 MG/2ML IJ SOLN
4 | INTRAMUSCULAR | Status: AC
Start: 2016-07-23 — End: 2016-07-23

## 2016-07-23 MED ORDER — ONDANSETRON HCL 4 MG/2ML IJ SOLN
4 | Freq: Once | INTRAMUSCULAR | Status: AC | PRN
Start: 2016-07-23 — End: 2016-07-23

## 2016-07-23 MED ORDER — GLYCOPYRROLATE 1 MG/5ML IV SOSY
1 | INTRAVENOUS | Status: AC
Start: 2016-07-23 — End: 2016-07-23

## 2016-07-23 MED FILL — FENTANYL CITRATE (PF) 100 MCG/2ML IJ SOLN: 100 MCG/2ML | INTRAMUSCULAR | Qty: 2

## 2016-07-23 MED FILL — GENTAMICIN SULFATE 40 MG/ML IJ SOLN: 40 MG/ML | INTRAMUSCULAR | Qty: 2

## 2016-07-23 MED FILL — ONDANSETRON HCL 4 MG/2ML IJ SOLN: 4 MG/2ML | INTRAMUSCULAR | Qty: 2

## 2016-07-23 MED FILL — MIDAZOLAM HCL 2 MG/2ML IJ SOLN: 2 MG/ML | INTRAMUSCULAR | Qty: 2

## 2016-07-23 MED FILL — NAROPIN 5 MG/ML IJ SOLN: 5 MG/ML | INTRAMUSCULAR | Qty: 60

## 2016-07-23 MED FILL — NEOSTIGMINE METHYLSULFATE 5 MG/5ML IV SOSY: 5 MG/ML | INTRAVENOUS | Qty: 5

## 2016-07-23 MED FILL — GLYCOPYRROLATE 1 MG/5ML IV SOSY: 1 MG/5ML | INTRAVENOUS | Qty: 5

## 2016-07-23 MED FILL — LIDOCAINE HCL (CARDIAC) 20 MG/ML IV SOLN: 20 MG/ML | INTRAVENOUS | Qty: 5

## 2016-07-23 MED FILL — DEMEROL 25 MG/ML IJ SOLN: 25 MG/ML | INTRAMUSCULAR | Qty: 1

## 2016-07-23 MED FILL — VANCOMYCIN HCL 1000 MG IV SOLR: 1000 MG | INTRAVENOUS | Qty: 1250

## 2016-07-23 MED FILL — DEXAMETHASONE SODIUM PHOSPHATE 4 MG/ML IJ SOLN: 4 MG/ML | INTRAMUSCULAR | Qty: 1

## 2016-07-23 MED FILL — CEFAZOLIN SODIUM 1 G IJ SOLR: 1 g | INTRAMUSCULAR | Qty: 3000

## 2016-07-23 NOTE — Anesthesia Pre-Procedure Evaluation (Signed)
ANESTHESIA PRE-OP NOTE  NAME: Jamie LombardKeith Winship  DOB: 1995/08/13  AGE: 21 y.o.  MED. REC. #: [id number]  DOS: 07/23/16   PROCEDURE:  LEFT ANKLE ARTHROSCOPY WITH SYNOVECTOMY; LATERAL ANKLE LIGAMENT RECONSTRUCTION MODIFIED CHRISMAN-SNOOK TYPE PROCEDURE  SURGEON: SAMARCO      PSH:  has no past surgical history on file.    There is no problem list on file for this patient.      PERSONAL/FAMILY ANESTHESIA PROBLEMS: NO PROBLEMS     PMH: No past medical history on file.     ALLERGIES: Patient has no known allergies. Height: 5\' 10"  (177.8 cm) Weight: 180 lb (81.6 kg)   MEDICATIONS:NO PROBLEMS       CV: NO PROBLEMS     PULMONARY: NO PROBLEMS     ENDOCRINE: NO PROBLEMS     GI: NO PROBLEMS     GU: NO PROBLEMS     NEURO/PSYCH: NO PROBLEMS     MUSCULOSKELETAL: NO PROBLEMS     HEME/ONC: NO PROBLEMS     OTHER: NO PROBLEMS  2           AIRWAY ASSESSMENT:  MALLAMPATI: 2 DENTITION: OK ROM: FULL   ANESTHETIC PLAN: GA ASA :  2    NPO : MN  Peri-operative block planned: POP./SAPHENOUS  CONSENT: Risks/benefits/options/questions discussed. Patient agrees:  Jamie Flores  Jamie Dettmer M. Skylen Spiering,MD

## 2016-07-23 NOTE — Progress Notes (Signed)
[facility] PACU Education and Care Plan Goals  The following items will be achieved upon completion of the patient's transfer or discharge from the PACU:    Post Operative Pain Management                                                                               '[x]'$  Patient will verbalize understanding of pain scale and pain management.  '[x]'$  Patient achieves predetermined pain goal of 4  '[x]'$  Self reports a comfort level acceptable for discharge  '[]'$  Other     Fall Risk Potential  '[x]'$  Due to Perioperative medication administration  Additional Risk Identified:   '[x]'$  Sensory deficit         '[x]'$  Motor deficit         '[x]'$  Balance problem         '[]'$  Home medication         '[]'$  Uses assistive device to ambulate    Goal(s) for fall prevention:  '[]'$  Prevent fall or injury by calling for assistance with activity and use of siderails while hospitalized  '[]'$  Prevent fall or injury by using assistance with activity after discharge.  '[]'$  Patient / Significant other verbalize understanding in use of any ordered assistive devices    Mobility Safety/ ADL  '[x]'$  Reach a functional mobility goal within limitations of the procedure.    Infection Precautions                                                                                                            '[]'$ Patient understands implementation of infection precautions (see [facility] Presurgical Instructions and SSI Prevention Handout)    Post operative Assessment and Care                                                             '[x]'$  Standards of care met as delineated by ASPAN.                                                              Discharge Education and Goals  '[x]'$ Patient voices understanding of PACU discharge criteria  '[x]'$ Outpatient / significant other voices understanding of home care and follow up procedures (See [facility] Procedure Discharge Instructions)  '[]'$  Patient / significant other understanding of Special Needs:  '[]'$  Cooling device  '[]'$ Wound Support  Device  [  x] Crutches   []Drain    [] Walker   []Other  [] Inpatient / significant other understands the plan for transfer to the inpatient unit

## 2016-07-23 NOTE — Brief Op Note (Signed)
Brief Postoperative Note    Jamie LombardKeith Pianka  Date of Birth:  [birth year]  [id number]    Pre-operative Diagnosis: Left ankle sprain, ankle instability     Post-operative Diagnosis: Same    Procedure: Left ankle arthroscopy with synovectomy, Lateral ankle ligament reconstruction (modified Chrisman-Snook type procedure)    Anesthesia: General    Surgeons/Assistants: Matthew SarasVincent Feleshia Zundel, MD ; Cindee Lameara Paxson, PA-C    Estimated Blood Loss: less than 50     Complications: None    Specimens: Was Obtained: Suture and scar tissue left ankle    Findings: See Above.     Electronically signed by Adella HareVincent J Noel Rodier, MD on 07/23/2016 at 12:38 PM

## 2016-07-23 NOTE — Progress Notes (Signed)
Ambulatory Surgery/Procedure Discharge Note  Post op note and instructions  Left ankle drsg clean dry and intact  Ice to operative area with instructions  1 Rx given for norco  Verbal and written discharge instructions given to pt and mom  IV dcd, fluids taken very well  No nausea   No pain  Pt stable and states he wants to go home  Did not need to void prior to discharge  Incentive spirometry  Taught and used for discharge use  Instructed to take ASA  bid  dcd to car per wheelchair with family present    Vitals:    07/23/16 1415   BP: 136/77   Pulse: 94   Resp: 17   Temp: 97.3 F (36.3 C)   SpO2:        No intake/output data recorded.    Pain assessment:  level of pain (1-10, 10 severe),   Pain Level: 4    Patient discharged to home/self care. Patient discharged via wheel chair by transporter to waiting family/S.O.       07/23/2016 3:12 PM  [facility] AMBULATORY PROCEDURE DISCHARGE INSTRUCTIONS    You may be drowsy or lightheaded after receiving sedation or anesthesia. Do not operate any vehicles (automobiles, bicycles, motorcycles) or power tools or machinery for 24 hours.  Do not sign any legal documents or make any legal decisions for 24 hours. Do not drink alcohol for 24 hours or while taking narcotic pain medication.  A responsible person should be with you for the next 24 hours.    Please follow the instructions checked below:      DIET INSTRUCTIONS:  Start with light diet and progress to your normal diet as you feel like eating. If you experience nausea or repeated episodes of vomiting which persist beyond 12-24 hours, notify your doctor.             MEDICATION INSTRUCTIONS:  Prescription(S) x     sent with you.  Use as directed.  When taking pain medications, you may experience the side effect of dizziness or drowsiness.  Do not drink alcohol or drive when taking these medications.  Prescription(S) x          Called to Pharmacy Name and location:    Give the list of your medications to  your primary care physician on your next visit. Keep your med list updated and carry it with in case of emergencies.     Narcotic pain medications can cause the side effect of significant constipation.  You may want to add a stool softener to your postoperative medication schedule or speak to your surgeon on how best to manage this side effect.    If you have a CPAP machine, it is very important that you use it daily during all periods of sleep and daytime rest during your recovery at home.  Surgery and Anesthesia place a significant amount of stress on your body.  Using your CPAP will help keep you safe and lessen the negative effects of that stress.    FOLLOW-UP RECOVERY CARE:  Call the office  for follow-up appointment and problems    Watch for these possible complications or symptoms.  Call physician if they or any other problems occur:  - Signs of INFECTION   > Fever over 101     > Redness, swelling, hardness or warmth at the operative site   >Foul smelling or cloudy drainage at the operative site   - Unrelieved PAIN  -  Unrelieved NAUSEA  - Blood soaked dressing.  (Some oozing may be normal)  - Inability to urinate      - Numb, pale, blue, cold or tingling extremity      Physician:  Dr Jobie QuakerSammarco    The above instructions were reviewed with patient/significant other.  The following additional patient specific information was reviewed with the patient/significant other:  [] Procedure/physician specific instructions  [] Medication information sheet(S)  [] Dionne'S egress test  [] Pain Ball management  [] FAQ Catheter associated blood stream infections  [] FAQ Surgical Site Infections  [] Other-    I have read and understand the instructions given to me: ____________________________________________   (Patient/S.O. Signature)            Date/time 07/23/2016 3:12 PM         PACU:  [phone]   M-F 700 AM - 7 PM      SAME DAY SERVICES:  [phone] M-F 7AM-6PM        If you smoke STOP. We care about your health!

## 2016-07-23 NOTE — H&P (Signed)
Jamie LombardKeith Flores    [id number]    [facility] H & P  Department of General Surgery   Surgical Service   CNP Pre-operative History and Physical  Last H & P within the last 30 days.    DIAGNOSIS:   SPRAIN OF OTHER LIGAMENT OF LEFT     PROCEDURE:  Left Ankle Arthroscopy With Synovectomy;                                Lateral Ankle Ligament Reconstruction Modified Chrisman-Snook Type Procedure      HISTORY OF PRESENT ILLNESS: Pt. Is a 21 y.o. Male who c/o of left ankle pain, stiffness,limited ROM and inability to achieve and maintain FWB status with ambulation. Sx. Not relieved with conservative treatment. Pt. Is now here for surgical intervention.    Please see initial H & P     Past Medical History:    No past medical history on file.     Past Surgical History:    No past surgical history on file.     Past Social History:  Social History     Social History   . Marital status: Single     Spouse name: N/A   . Number of children: N/A   . Years of education: N/A     Social History Main Topics   . Smoking status: Not on file   . Smokeless tobacco: Not on file   . Alcohol use Not on file   . Drug use: Unknown   . Sexual activity: Not on file     Other Topics Concern   . Not on file     Social History Narrative   . No narrative on file         Medications Prior to Admission:      Prior to Admission medications    Not on File         Allergies:  Patient has no known allergies.    PHYSICAL EXAM:      BP 129/80   Pulse 75   Temp 98.3 F (36.8 C) (Oral)   Resp 16   Ht 5\' 10"  (1.778 m)   Wt 180 lb (81.6 kg)   SpO2 96%   BMI 25.83 kg/m      Heart:  regular rate and rhythm,no murmur     Lungs:  No increased work of breathing, good air exchange, clear to auscultation bilaterally, no crackles or wheezing    Abdomen:  soft, non-distended, non-tender, no rebound tenderness or guarding, normal active bowel sounds and no masses palpated    ASSESSMENT AND PLAN:    1.  Patient seen and focused exam done  today- no new changes since last physical exam on 07/21/16    2.  Access to ancillary services are available per request of the provider.    Marijean HeathKathleen Dhrithi Riche, APRN - CNP     07/23/2016

## 2016-07-23 NOTE — Progress Notes (Signed)
BLOCK OVER TOLERATED WELL STILL AWAKE

## 2016-07-23 NOTE — Discharge Instructions (Signed)
[facility]  [phone]    POSTOPERATIVE INSTRUCTIONS    - For the first 48 hours after your surgery, rest and elevate the surgical area as much as possible.     - Application of an ice pack to the area is helpful. A small amount of drainage or swelling may be expected.    - Side effects to anesthesia may include nausea, lightheadedness, coughing, sore throat, and/or muscle aches. Rest, fluids, and light foods can help.    Please call our office if:      - Your hand or foot becomes numb, blue, or cold. It is normal to experience numbness 12 to 24 hrs after surgery if you did receive a nerve block.    - You have severe pain or burning which is not relieved with your pain medication.     - Your incision has become reddened or swollen, painful or has excessive bleeding or foul smelling drainage.    You should have your post-op appointments scheduled.  Please check your surgery packet for dates and times of these appointments.  Please call the office with questions.     You have been given prescriptions for: HYDROCODONE    Take Aspirin 325 mg twice daily until instructed to discontinue this medication.     If your lower extremity was operated on:    Crutches/Roll-about/Wheelchair as needed to aid in ambulation.     You are to remain nonweight bearing on your operative extremity.     You may loosen your Ace wrap: as necessary for pain control    Your dressing will be changed at your first post-op visit.     It is ok to shower 2 days after surgery, but must keep dressings clean and dry.      [facility] AMBULATORY PROCEDURE DISCHARGE INSTRUCTIONS    You may be drowsy or lightheaded after receiving sedation or anesthesia. Do not operate any vehicles (automobiles, bicycles, motorcycles) or power tools or machinery for 24 hours.  Do not sign any legal documents or make any legal decisions for 24 hours. Do not drink alcohol for 24 hours or while taking narcotic pain medication.  A responsible person should be with you  for the next 24 hours.    Please follow the instructions checked below:      DIET INSTRUCTIONS:  [x] Start with light diet and progress to your normal diet as you feel like eating. If you experience nausea or repeated episodes of vomiting which persist beyond 12-24 hours, notify your doctor.             MEDICATION INSTRUCTIONS:  [x] Prescription(S) x  1   sent with you.  Use as directed.  When taking pain medications, you may experience the side effect of dizziness or drowsiness.  Do not drink alcohol or drive when taking these medications.  [] Prescription(S) x          Called to Pharmacy Name and location:    [x] Give the list of your medications to your primary care physician on your next visit. Keep your med list updated and carry it with in case of emergencies.    []  Narcotic pain medications can cause the side effect of significant constipation.  You may want to add a stool softener to your postoperative medication schedule or speak to your surgeon on how best to manage this side effect.    If you have a CPAP machine, it is very important that you use it daily during all periods of sleep  and daytime rest during your recovery at home.  Surgery and Anesthesia place a significant amount of stress on your body.  Using your CPAP will help keep you safe and lessen the negative effects of that stress.    FOLLOW-UP RECOVERY CARE:  [x] Call the office at [phone] for follow-up appointment and problems    Watch for these possible complications or symptoms.  Call physician if they or any other problems occur:  - Signs of INFECTION   > Fever over 101     > Redness, swelling, hardness or warmth at the operative site   >Foul smelling or cloudy drainage at the operative site   - Unrelieved PAIN  - Unrelieved NAUSEA  - Blood soaked dressing.  (Some oozing may be normal)  - Inability to urinate      - Numb, pale, blue, cold or tingling extremity      Physician:  Dr.Sammarco    The above instructions were reviewed with  patient/significant other.  The following additional patient specific information was reviewed with the patient/significant other:  [x] Procedure/physician specific instructions  [x] Medication information sheet(S)  [] Dionne'S egress test  [] Pain Ball management  [] FAQ Catheter associated blood stream infections  [x] FAQ Surgical Site Infections  [] Other-    I have read and understand the instructions given to me: ____________________________________________   (Patient/S.O. Signature)            Date/time 07/23/2016 2:08 PM         PACU:  [phone]   M-F 700 AM - 7 PM      SAME DAY SERVICES:  [phone] M-F 7AM-6PM        If you smoke STOP. We care about your health!

## 2016-07-23 NOTE — Progress Notes (Signed)
PACU Transfer to SDS    Vitals:    07/23/16 1415   BP: 136/77   Pulse: 94   Resp: 17   Temp: 97.3 F (36.3 C)   SpO2:          Intake/Output Summary (Last 24 hours) at 07/23/16 1432  Last data filed at 07/23/16 1325   Gross per 24 hour   Intake              650 ml   Output               25 ml   Net              625 ml       Pain assessment:  receiving treatment  Pain Level: 4    Patient transferred to care of SDS RN.    07/23/2016 2:32 PM

## 2016-07-23 NOTE — Op Note (Signed)
[facility]                 [address] [place], [place] [id number]                                 OPERATIVE REPORT    PATIENT NAME: Jamie Flores, Jamie                       DOB:        03-Apr-1995  MED REC NO:   [id number]                          ROOM:  ACCOUNT NO:   [id number]                          ADMIT DATE: 07/23/2016  PROVIDER:     Matthew SarasVincent Fionnuala Hemmerich, MD    DATE OF PROCEDURE:  07/23/2016    SURGEON:  Matthew SarasVincent Aerianna Losey, MD    SECOND SURGEON:  Cindee Lameara Paxson, PA-C    PREOPERATIVE DIAGNOSES:  1.  Left chronic ankle instability, status post previous repair.  2.  Left ankle synovitis.    POSTOPERATIVE DIAGNOSES:  1.  Left chronic ankle instability, status post previous repair.  2.  Left ankle synovitis.    OPERATIONS:  1.  Left ankle ligament reconstruction with modified Chrisman-Snook type  technique.  2.  Left ankle arthroscopy with extensive synovectomy.    ANESTHESIA:  General with block.    INDICATIONS:  This is a 21 year old gentleman with a history of chronic  ankle instability.  He has previously undergone operative repair of the  ligaments; however, this repair has failed, and the patient has continued  to have significant problems due to instability.  The risks and potential  benefits of the procedures were discussed with the patient.  He understands  these, was given the opportunity to ask questions.  His questions were  answered to his satisfaction.  He has given consent to proceed with the  above-outlined procedures.    OPERATIVE PROCEDURE:  The patient was brought to the operating room, placed  in the supine position on the operating table.  After induction of general  anesthetic, he was turned to a semi-lateral decubitus position on a bean  bag patient positioner.  A gel axillary roll was applied, and all  prominences were well-padded.  A pneumatic tourniquet was placed around the  patient's left proximal thigh and set to 350 mmHg.  The left leg was  then  prepped and draped free in the usual sterile fashion.    A second surgeon was necessary throughout the procedure due to the complex  nature of the procedure and the multiple procedures being performed.  In  addition, the patient had had previous surgery, and this had caused  extensive scarring at the lateral aspect of his ankle.  A second surgeon  was necessary to aid with identification and protection of neurologic and  vascular structures in the scarred soft tissue envelope.  A second surgeon  was necessary due to the patient's increased body size to aid with  appropriate positioning of the patient and positioning of the extremity  during the procedure.  A second surgeon was necessary to decrease overall  operative time and to improve the patient's safety and outcome.    Ankle  arthroscopy with extensive synovectomy.  At this point, the previous  surgical scars were used to identify the anterolateral and anteromedial  arthroscopy portals.  These were examined clinically and noted to be in  adequate position.  These were therefore re-incised with a #11 scalpel,  using a small nick and spreading the subcutaneous tissue in the anterior  joint capsule with a straight hemostat, this allowed introduction of a  blunt trocar and cannula into the patient's ankle and then a video  arthroscope through the cannula.  Significant anterior arthrofibrosis of  the ankle joint was present, and a high-speed handheld shaver was used to  perform a complete synovectomy and debridement of fibrotic scar tissue  which had formed and was clearly impinging throughout the entire anterior  aspect of the ankle.  An area of this tissue was quite dense and need to be  morselized with a small punch.  Following debridement of the scarred  anterior joint capsule, this allowed much better visualization of the  anterior aspect of the ankle.  The joint was distracted and gross  instability was appreciated.  A positive drive-through sign was  appreciated  allowing passage of instruments from anterior to posterior through the  lateral aspect of the joint.  Hypertrophic synovitis was extending from the  inferior aspect of the tibiofibular joint and a synovectomy performed at  this time.  Stress external rotation testing, however, revealed good  stability of the tibiofibular syndesmosis and deltoid ligament complex.   The articular surfaces were noted to be intact without evidence of  degeneration or arthritis.  Following arthroscopy, the ankle was irrigated  and the arthroscopy portals closed with 3-0 nylon suture.    Lateral ankle ligament reconstruction (modified Chrisman-Snook).  At this  point, the extremity was exsanguinated with the Esmarch tourniquet, and  pneumatic tourniquet inflated.  The previous incision laterally was  utilized to perform the surgery.  This was re-incised and dissection  carried out through the subcutaneous tissue and deeper tissue.  Care was  taken to identify and protect the superficial peroneal nerve of the  anterior aspect of the wound and care was taken to avoid the sural nerve  posteriorly.  Dissection was carried out to the level of the previous  surgery where Ethibond suture was appreciated diffusely throughout the  lateral ankle ligament complex.  This had caused significant scarring, and  this tissue was simply excised.  Inspection of the anterior talofibular  ligament was identified, and this was noted to be quite flimsy and thin and  was inadequate for a direct repair.  Similarly, the calcaneofibular  ligament was thin, although this was simply left in place during the  procedure and we reconstructed directly over this ligament.  A pre-sutured  tendon graft from Arthrex was thawed and irrigated with normal saline with  gentamicin, then placed on the Arthrex graft prep station to decrimp the  graft.  This was allowed to sit for about 15 minutes and kept moist through  this time.  During this time, the drill holes for  the reconstruction were  created.  The anatomic insertion point of the anterior talofibular ligament  was identified, and a Beath pin applied in this area.  This was over-reamed  with a 5 mm reamer to a depth of 20 mm in the talar neck.  The anatomic  origin of the anterior talofibular ligament was identified at the anterior  aspect of the fibula and a Beath pin applied at this level and  brought out  posteriorly through the fibula.  This was also over-reamed with a 5 mm  Arthrex reamer.  A 5 mm Arthrex reamer was then applied from the calcaneal  tuber at the anatomic insertion point of the calcaneal fibular ligament and  brought out medially through a small stab wound incision in the medial  plantar calcaneus, well posterior to the neurovascular bundle.  This was  over-reamed with the Arthrex 5.5 mm reamer.  The graft at this point was  secured to an Arthrex 4.75 x 15 Bio-Tenodesis screw on its insertion device  using the appropriate suture.  The graft was inserted in the talar neck and  brought to the base of the previous drill hole and secured with the 4.75 x  15 Bio-Tenodesis screw.  This afforded rigid and excellent compression and  stability of the graft into the talus.  The graft was then passed from  anterior to posterior through the fibular drill hole and tensioned with the  ankle at neutral dorsiflexion with the talus held well reduced in the AP  plane into the ankle mortise.  The graft was tensioned appropriately, and  the graft secured in the fibula with a 4.75 x 15 Bio-Tenodesis screw.  The  graft was then routed deep to the peroneal tendons and brought into the  calcaneal drill hole.  The ankle was held at neutral dorsiflexion with the  heel in slight eversion and the graft tensioned appropriately and secured  with an Arthrex 5.5 x 15 Bio-Tenodesis screw.  The terminal portion of the  graft and suture at this point was excised from the medial wound.  All  wounds at this point were copiously irrigated  with a sterile lavage  solution.  The ankle was brought through a full range of motion, noted to  have excellent stability and strength with correction of the anterior  drawer and talar tilt signs.  A peroneal tenosynovectomy was performed at  this point as a synovitis was present, however, the tendons were inspected  and noted to be free of any degeneration or tearing.  The wounds at this  point were closed in layers.  The peroneal retinaculum was re-approximated  with 2-0 PDS suture.  The subcutaneous tissues were re-approximated with  3-0 Vicryl suture.  The skin edges were re-approximated with interrupted  3-0 nylon.    There were no drains and no complications.  The sponge and needle counts  were noted to be correct at the end of the procedure by the nursing staff.    Following closure, a sterile lightly compressive dressing was applied as  well as a well-padded posterior splint, and the patient was brought to the  postanesthesia care unit in stable condition.        Matthew Saras, MD    D: 07/23/2016 17:06:12       T: 07/23/2016 17:10:07     [email or web address]  Job#: [id number]     Doc#: [id number]    CC:

## 2016-07-23 NOTE — Procedures (Addendum)
POPLITEAL/SAPHENOUS    Patient location during procedure: pre-op  Start time: 07/23/2016 10:00 AM  End time: 07/23/2016 10:20 AM  Staffing  Performed: anesthesiologist   Preanesthetic Checklist  Completed: patient identified, site marked, surgical consent, pre-op evaluation, timeout performed, IV checked, risks and benefits discussed, monitors and equipment checked, anesthesia consent given, oxygen available and patient being monitored  Peripheral Block  Patient position: prone  Prep: ChloraPrep  Patient monitoring: cardiac monitor, continuous pulse ox, frequent blood pressure checks and IV access  Block type: Sciatic and Saphenous  Laterality: left  Injection technique: single-shot  Procedures: ultrasound guided and nerve stimulator  Local infiltration: ropivacaine  Infiltration strength: 0.5 %  Dose: 3 mL  Provider prep: sterile gloves  Local infiltration: ropivacaine  Needle  Needle type: pencil-tip   Needle gauge: 20 G  Needle length: 10 cm  Needle localization: ultrasound guidance and nerve stimulator  Needle insertion depth: 4 cm  Test dose: negative  Assessment  Injection assessment: negative aspiration for heme  Slow fractionated injection: yes  Hemodynamics: stable  Additional Notes  35 ML 0.5 % ROPIVACAINE INJECTED AROUND TIBIAL AND COMMON PERONEAL NERVES IN POPLITEAL FOSSA.  8 ML 0.5 % ROPIVACAINE INJECTED FOR SAPHENOUS ABOVE LT. ANKLE  Reason for block: post-op pain management

## 2016-07-23 NOTE — Progress Notes (Signed)
Patient arrived from OR to PACU # 12 s/p LATERAL ANKLE LIGAMENT RECONSTRUCTION MODIFIED CHRISMAN-SNOOK. Attached to PACU monitoring device, report received from CRNA who reported patient stable for surgery. Received pre-op block.

## 2016-07-23 NOTE — Anesthesia Post-Procedure Evaluation (Signed)
Anesthesia Post-op Note    Patient: Jamie Flores    Procedure(s) Performed:  Left ankle arthroscopy with synovectomy, Lateral ankle ligament reconstruction (modified Chrisman-Snook type procedure  DOS : 07/23/16  Surgeon : Cammie SickleSAMARCO       Anesthesia type: general and regional      Post-op assessment:  Anesthetic Problems: no   Last Vitals:  height is 5\' 10"  (1.778 m) and weight is 180 lb (81.6 kg). His oral temperature is 98 F (36.7 C). His blood pressure is 143/84 (abnormal) and his pulse is 101. His respiration is 16 and oxygen saturation is 95%.   Cardiovascular System Stable: yes  Respiratory Function: Airway Patent yes  ETT no  Ventilator no  Level of consciousness: awake, alert and oriented  Post-op pain: adequate analgesia  Hydration Adequate: yes  Nausea/Vomiting:no          [name]  5:25 PM

## 2016-07-25 LAB — CULTURE, MRSA, SCREENING

## 2017-05-03 ENCOUNTER — Inpatient Hospital Stay
Admit: 2017-05-03 | Discharge: 2017-05-03 | Disposition: A | Discharge status: Home / Self Care | Payer: PRIVATE HEALTH INSURANCE | Attending: Emergency Medicine

## 2017-05-03 DIAGNOSIS — G8929 Other chronic pain: Secondary | ICD-10-CM

## 2017-05-03 MED ORDER — HYDROCODONE-ACETAMINOPHEN 5-325 MG PO TABS
5-325 MG | ORAL_TABLET | ORAL | 0 refills | Status: AC | PRN
Start: 2017-05-03 — End: 2017-05-06

## 2017-05-03 MED ORDER — CRUTCHES-ALUMINUM MISC
0 refills | 20.00000 days | Status: AC | PRN
Start: 2017-05-03 — End: ?

## 2017-05-03 NOTE — ED Provider Notes (Signed)
CHIEF COMPLAINT  Ankle Pain (had surgery on june 26.. has had problems ever since surgery...lattly it has been hard to sleep .Marland Kitchen or even go bowling..he states workman comp suggested he get it reavaluated)    HISTORY OF PRESENT ILLNESS  Jamie Flores is a 22 y.o. male who presents to the ED complaining of increased  Left ankle pain. He had surgery on his ankle in June 2018. He said it was for a problem with his ligaments and tendons. He says that lately the pain is been getting worse even keeping you up at night hard to go bowling and his Workmen's Comp. Suggested that he get reevaluated. Patient has seen his surgeon since then it has been told that there is a possibility that the pain that he has may not go away. Patient denies any new trauma to the ankle. He says he does feel some numbness on the lateral part of the left foot and ankle he said that has been since the surgery he said that his surgeon is aware of this. He said he was told that this is normal.  No other complaints, modifying factors or associated symptoms.     I have reviewed the following from the nursing documentation.    Past Medical History:   Diagnosis Date   . History of ankle surgery 07/21/2016   . Wisdom teeth extracted 2014     Past Surgical History:   Procedure Laterality Date   . FOOT SURGERY Left 07/23/2016    LEFT ANKLE ARTHROSCOPY WITH SYNOVECTOMY ;     History reviewed. No pertinent family history.  Social History     Socioeconomic History   . Marital status: Single     Spouse name: Not on file   . Number of children: Not on file   . Years of education: Not on file   . Highest education level: Not on file   Occupational History   . Not on file   Social Needs   . Financial resource strain: Not on file   . Food insecurity:     Worry: Not on file     Inability: Not on file   . Transportation needs:     Medical: Not on file     Non-medical: Not on file   Tobacco Use   . Smoking status: Never Smoker   . Smokeless tobacco: Never Used    Substance and Sexual Activity   . Alcohol use: Yes     Comment: social   . Drug use: Not Currently   . Sexual activity: Not on file   Lifestyle   . Physical activity:     Days per week: Not on file     Minutes per session: Not on file   . Stress: Not on file   Relationships   . Social connections:     Talks on phone: Not on file     Gets together: Not on file     Attends religious service: Not on file     Active member of club or organization: Not on file     Attends meetings of clubs or organizations: Not on file     Relationship status: Not on file   . Intimate partner violence:     Fear of current or ex partner: Not on file     Emotionally abused: Not on file     Physically abused: Not on file     Forced sexual activity: Not on file   Other Topics  Concern   . Not on file   Social History Narrative   . Not on file     No current facility-administered medications for this encounter.      Current Outpatient Medications   Medication Sig Dispense Refill   . ibuprofen (ADVIL;MOTRIN) 800 MG tablet Take 800 mg by mouth every 6 hours as needed for Pain     . HYDROcodone-acetaminophen (NORCO) 5-325 MG per tablet Take 1 tablet by mouth every 4 hours as needed for Pain for up to 3 days. Intended supply: 3 days. Take lowest dose possible to manage pain 14 tablet 0   . Misc. Devices (CRUTCHES-ALUMINUM) MISC 2 each by Does not apply route as needed (TROUBLE WALKING) 2 each 0     No Known Allergies    REVIEW OF SYSTEMS  10 systems reviewed, pertinent positives per HPI otherwise noted to be negative.    PHYSICAL EXAM  BP 129/88   Pulse 74   Temp 98 F (36.7 C)   Resp 18   Ht 5\' 8"  (1.727 m)   Wt 250 lb (113.4 kg)   SpO2 96%   BMI 38.01 kg/m   GENERAL APPEARANCE: Awake and alert. Cooperative. No acute distress.  HEAD: Normocephalic. Atraumatic.  EYES: PERRL. EOM's grossly intact.   ENT: Mucous membranes are moist.   NECK: Supple.   HEART: RRR.   LUNGS: Respirations unlabored. CTAB. Good air exchange. Speaking  comfortably in full sentences.   BACK: No midline spinal tenderness or step-off.   ABDOMEN: Soft. Non-distended. Non-tender. No guarding or rebound. Normal bowel sounds.  EXTREMITIES: No peripheral edema. Moves all extremities equally. All extremities neurovascularly intact.   GU: Deferred  SKIN: Warm and dry. No acute rashes.   NEUROLOGICAL: Alert and oriented. No gross facial drooping. Strength 5/5, sensation intact. Normal coordination. Gait normal.   PSYCHIATRIC: Normal mood and affect.       ED COURSE/MDM  Patient seen and evaluated.I told the patient that I can give them prescription for something for pain and if he's concerned that the issue is getting worse and is evolving ligament are tentatively likely need an MRI of the ankle. I will give them an outpatient for an MRI he said he just had an x-ray of the ankle a few weeks ago and that everything was okay. I am unable to see the results of that in care everywhere or in the [facility] system. He says that he sees orthopedic at [facility] orthopedists. He says they are not affiliated with [employer] [facility] [place].    Discharge Medication List as of 05/03/2017  3:05 AM      START taking these medications    Details   HYDROcodone-acetaminophen (NORCO) 5-325 MG per tablet Take 1 tablet by mouth every 4 hours as needed for Pain for up to 3 days. Intended supply: 3 days. Take lowest dose possible to manage pain, Disp-14 tablet, R-0Print      Misc. Devices (CRUTCHES-ALUMINUM) MISC PRN Starting Mon 05/03/2017, Disp-2 each, R-0, Print             CLINICAL IMPRESSION  1. Chronic pain of left ankle        Blood pressure 129/88, pulse 74, temperature 98 F (36.7 C), resp. rate 18, height 5\' 8"  (1.727 m), weight 250 lb (113.4 kg), SpO2 96 %.    DISPOSITION  Jamie Flores was discharged to home in stable condition.     This chart was generated in part by using [employer] system and may  contain errors related to that system including errors in grammar, punctuation, and  spelling, as well as words and phrases that may be inappropriate. When dictating, effort is made to correct spelling/grammar errors. If there are any questions or concerns please feel free to contact the dictating provider for clarification.     Lissa Merlin, DO  EMERGENCY MEDICINE        Denzil Magnuson, DO  05/03/17 1433

## 2020-01-11 ENCOUNTER — Ambulatory Visit: Admit: 2020-01-11 | Discharge: 2020-01-11 | Payer: Worker's Compensation

## 2020-01-11 ENCOUNTER — Inpatient Hospital Stay: Admit: 2020-01-11 | Discharge: 2020-02-16 | Payer: Worker's Compensation | Attending: Orthopaedic Trauma

## 2020-01-11 DIAGNOSIS — M19072 Primary osteoarthritis, left ankle and foot: Secondary | ICD-10-CM

## 2020-01-11 DIAGNOSIS — M25572 Pain in left ankle and joints of left foot: Secondary | ICD-10-CM

## 2020-01-11 NOTE — Unmapped (Signed)
Jeffrey Waters is a 24 y.o. male who comes in for eval of chief complaint consisting of pain in his left ankle.  He has a very complex history.  Almost 4 years ago he sustained an ankle injury at work where his foot got stuck in Jeffrey Waters essentially hyperextended.  6 months later he had surgery by someone who does not recall.  He was having continued difficulty in 6 to 9 months later he saw an Dr. Jobie Waters and had surgery with him.  6 to 9 months later he was still having problems and saw Dr. Sandre Waters.  Dr. Sandre Waters did two surgeries.  Peroneal tendon reconstruction and a lateral ligament reconstruction.  The patient has become frustrated as he has not made progress and continues to have pain and inability to ambulate.  He feels like no one is able to provide him relief or solution.  His physical therapist note states that he has reached maximal improvement.  Given his young age she is very frustrated is that he is unable to work or do activities that he would like to.    PMH and ROS are signed and dated in the chart and will not be re-dictated.    PE:  The patient is awake, alert, and oriented and in no acute distress with a mildly obese body habitus, There is no height or weight on file to calculate BMI.Marland Kitchen  Examination of gait demonstrates an antalgic gait on the left side with a slightly shortened stance phase.  The affected left lower extremity and ankle demonstrates moderate tenderness over the distal syndesmosis over the lateral distal fibula and over the ATFL ligament with palpation.  ROM exam demonstrates ankle dorsiflexion to neutral with 20 to 25 degrees of plantarflexion and good hindfoot midfoot and forefoot motion.  Examination of instability is as follows: No gross instability of the ankle with negative talar tilt and negative drawer. The neuromuscular and vascular exam is as follows:  Neurovascular exam LE - Knee flexion and extension are 5/5 motor strength. EHL, tibialis anterior and gastroc soleus complex  are intact with 5/5 motor strength.  The light touch is intact plantar, in the first web space but decreased in the lateral border of the foot., and palpable pulses in the dorsalis pedis and posterior tibia with cap refill brisk in all digits.The skin examination shows a well-healed lateral ankle incision.  The patient's cardiovascular and respiratory exam demonstrates regular rate and rhythm with a normal respiration and a soft abdomen to palpation.      RADIOGRAPHS including AP lateral mortise left ankle demonstrate overall normal alignment of the foot with a well-maintained joint.  There are some small bone spurs along the medial malleolus and the lateral malleolus.  He has reconstructive drill holes in the talus and in the lateral fibula.  Overall no bony pathology or malalignment.    ASSESSMENT AND PLAN:  Jeffrey Waters presents with the following condition: Chronic left ankle pain status post for surgical reconstruction attempts with what appears to be maximum improvement physical therapy.  It is really unclear to me the etiology of his pain but given the extent of pain and the chronicity I think it is reasonable to obtain an MRI at this point to reassess ligamentous and tendinous stability around the ankle.  In addition he may have an osteonecrotic lesion or an osteochondral lesion that is not picked up on plain radiographs.  We spent approx 30 minutes of complex medical decision making discussing the treatment options, including obtaining  a new MRI and referring him to my partner Dr. Ludwig Waters who has nearly 30 years experience in complex foot and ankle reconstruction and surgery.  The patient understands and agrees with our plan and will call if there are any questions or problems.
